# Patient Record
Sex: Female | Born: 1980 | Race: Black or African American | Hispanic: No | Marital: Married | Smoking: Current every day smoker
Health system: Southern US, Community
[De-identification: ages and names within clinical notes are randomized; demographics above are authoritative.]

## PROBLEM LIST (undated history)

## (undated) DIAGNOSIS — I10 Essential (primary) hypertension: Secondary | ICD-10-CM

## (undated) HISTORY — DX: Essential (primary) hypertension: I10

---

## 2000-11-29 ENCOUNTER — Encounter: Payer: Self-pay | Admitting: Obstetrics

## 2000-11-29 ENCOUNTER — Inpatient Hospital Stay (HOSPITAL_COMMUNITY): Admission: AD | Admit: 2000-11-29 | Discharge: 2000-11-29 | Payer: Self-pay | Admitting: Obstetrics

## 2002-05-07 ENCOUNTER — Emergency Department (HOSPITAL_COMMUNITY): Admission: EM | Admit: 2002-05-07 | Discharge: 2002-05-07 | Payer: Self-pay | Admitting: Emergency Medicine

## 2002-09-26 ENCOUNTER — Emergency Department (HOSPITAL_COMMUNITY): Admission: EM | Admit: 2002-09-26 | Discharge: 2002-09-26 | Payer: Self-pay | Admitting: Emergency Medicine

## 2004-04-10 ENCOUNTER — Emergency Department (HOSPITAL_COMMUNITY): Admission: EM | Admit: 2004-04-10 | Discharge: 2004-04-10 | Payer: Self-pay | Admitting: Family Medicine

## 2005-10-19 ENCOUNTER — Ambulatory Visit (HOSPITAL_COMMUNITY): Admission: RE | Admit: 2005-10-19 | Discharge: 2005-10-19 | Payer: Self-pay | Admitting: Obstetrics & Gynecology

## 2006-03-27 ENCOUNTER — Inpatient Hospital Stay (HOSPITAL_COMMUNITY): Admission: AD | Admit: 2006-03-27 | Discharge: 2006-03-31 | Payer: Self-pay | Admitting: Obstetrics

## 2006-07-17 IMAGING — US US OB COMP +14 WK
1 series · 13 of 28 positions shown · non-contrast
Comparison: none

CLINICAL DATA: 18 week 0 day gestational age by LMP.  Evaluate dating and anatomy.

[Series 1: us ob comp +14 wk · 0.27mm/px · 13 of 122 slices shown]
[im 5/122]
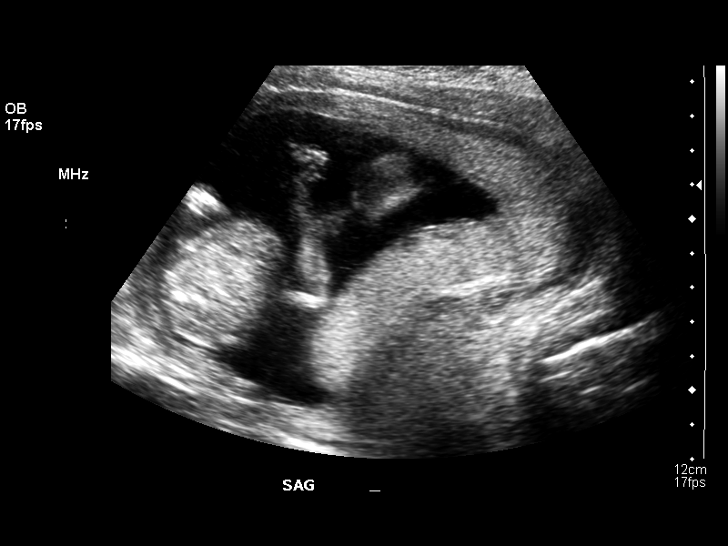
[im 14/122]
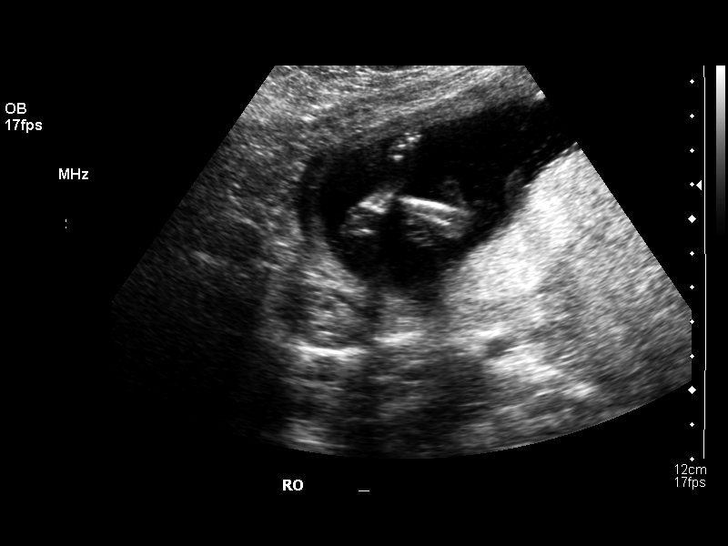
[im 23/122]
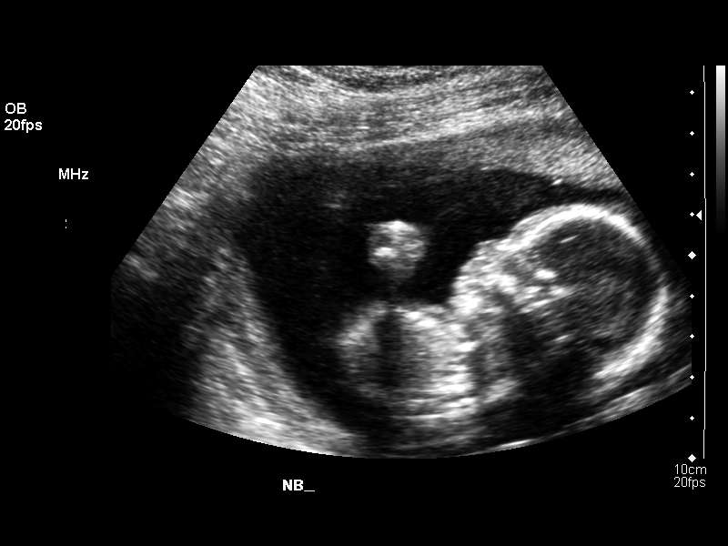
[im 32/122]
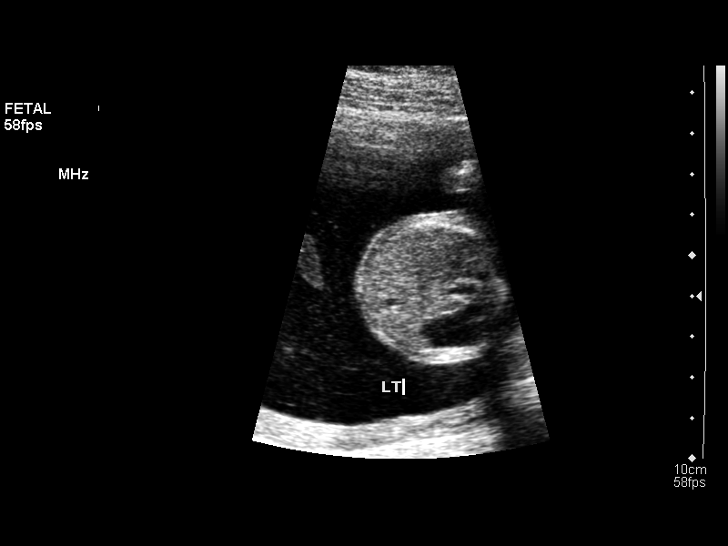
[im 41/122]
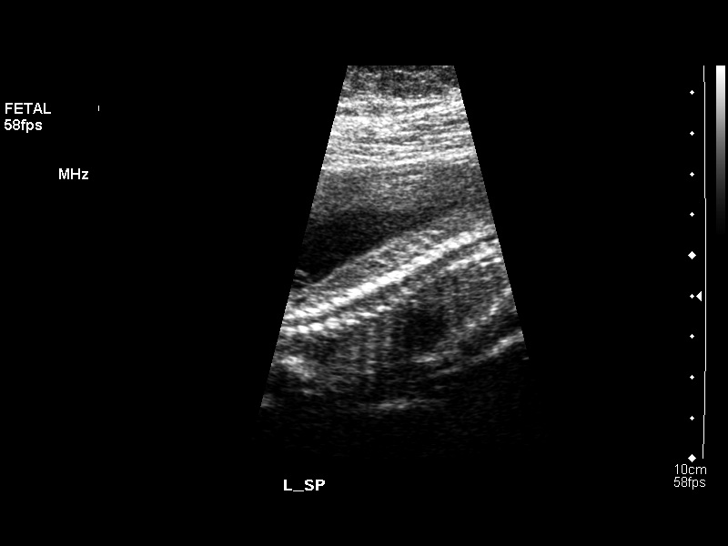
[im 50/122]
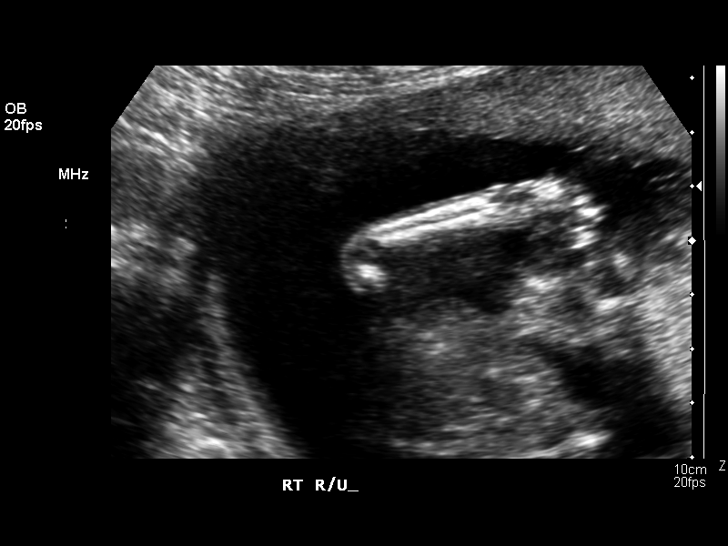
[im 63/122]
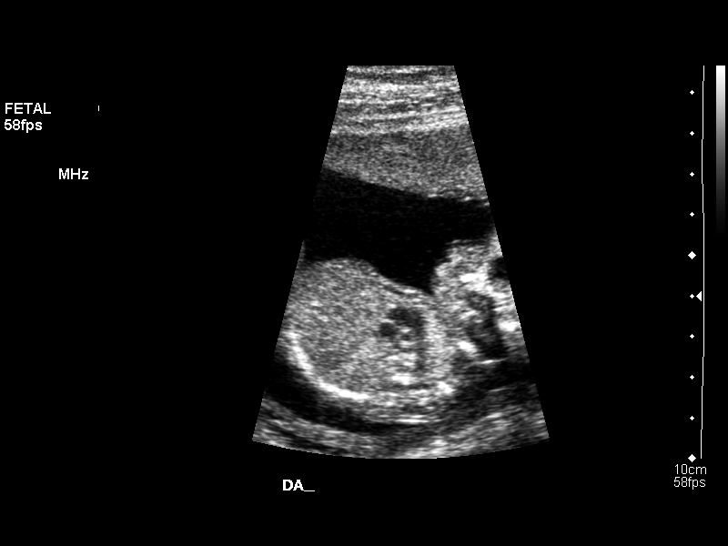
[im 72/122]
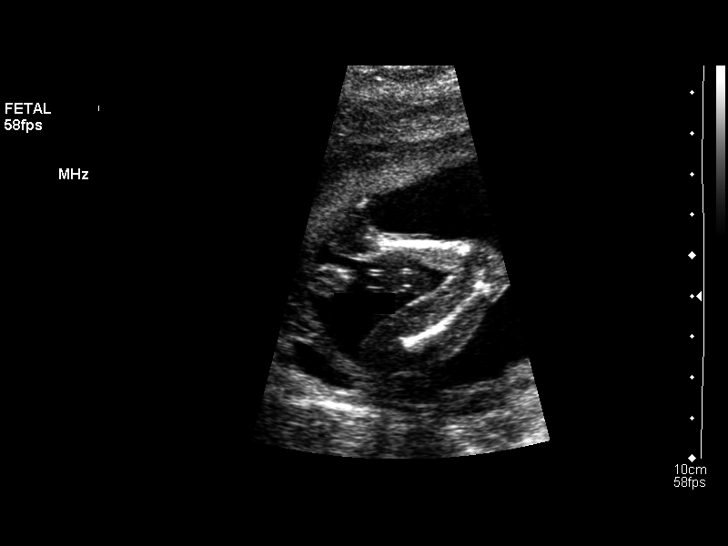
[im 81/122]
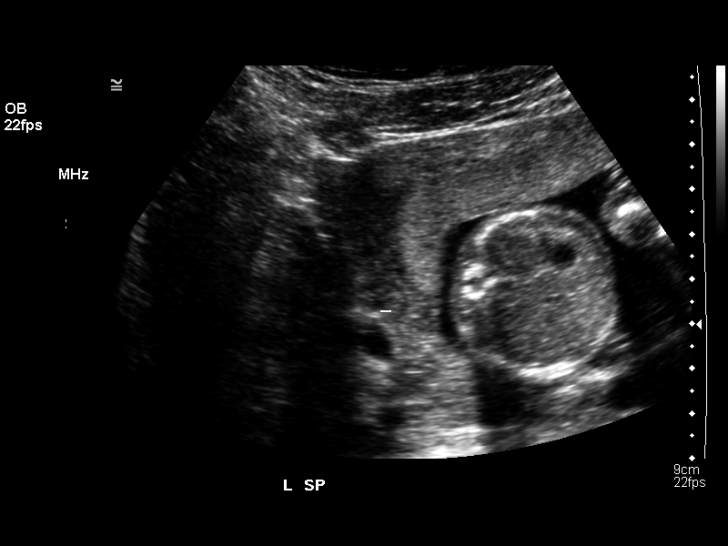
[im 90/122]
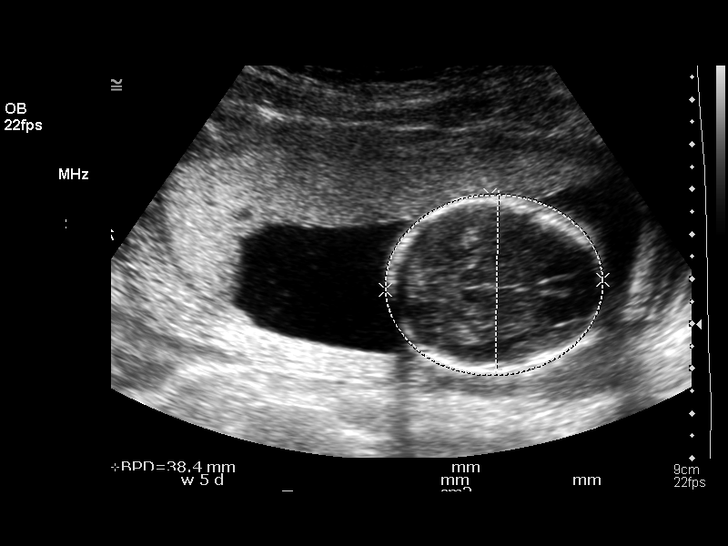
[im 99/122]
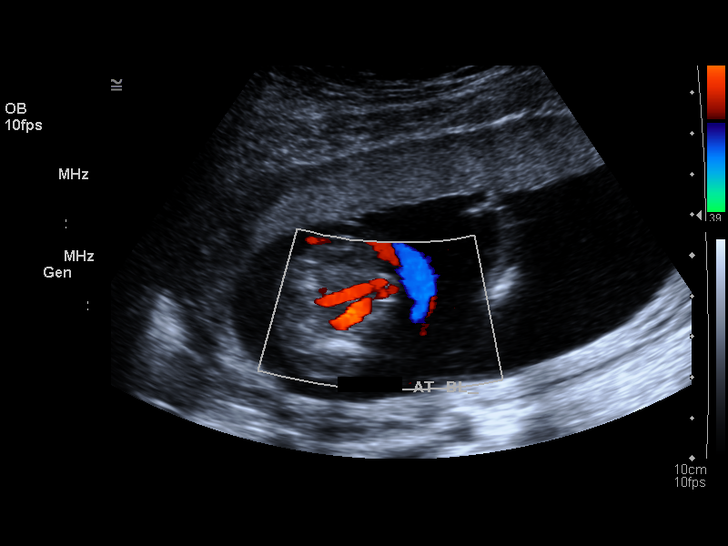
[im 108/122]
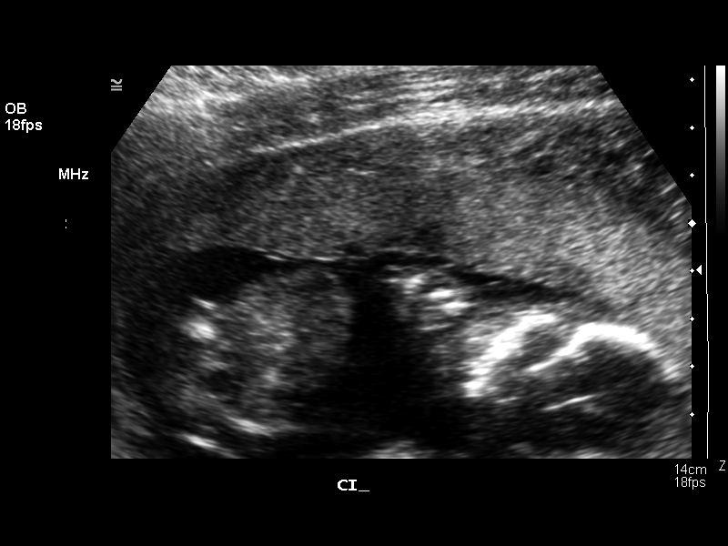
[im 117/122]
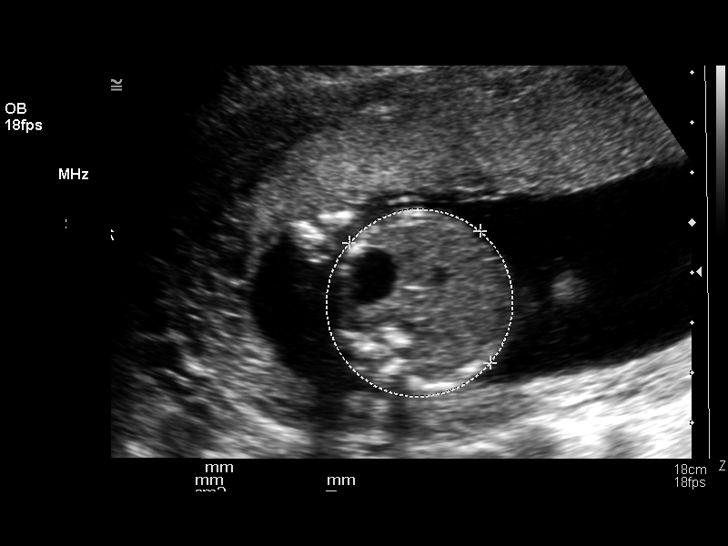

[13 of 28 positions shown; findings below may reference images not displayed]

OBSTETRICAL ULTRASOUND:
 Number of Fetuses:  1
 Heart Rate:  147 BPM
 Movement:  Yes
 Breathing:  No  
 Presentation:  Cephalic
 Placental Location:  Anterior
 Grade:  I
 Previa:  No
 Amniotic Fluid (Subjective):  Normal
 Amniotic Fluid (Objective):   4.0 cm vertical pocket 

 FETAL BIOMETRY
 BPD:   3.8 cm  17 W 5 D
 HC:   14.1 cm  17 W 3 D
 AC:   11.8 cm  17 W 3 D
 FL:   2.7 cm  18 W 2 D

 MEAN GA:  17 W 5 D  Ultrasound EDC:  03/24/06

 FETAL ANATOMY
 Lateral Ventricles:  Visualized 
 Thalami/CSP:  Visualized   
 Posterior Fossa:  Visualized   
 Nuchal Region:  Visualized 
 Spine:  Visualized   
 4 Chamber Heart on Left:  Visualized   
 Stomach on Left:  Visualized   
 3 Vessel Cord:  Visualized 
 Cord Insertion site:  Visualized 
 Kidneys:  Visualized   
 Bladder:  Visualized   
 Extremities:  Visualized   

 ADDITIONAL ANATOMY VISUALIZED:  RVOT, LVOT, upper lip, orbits, profile, diaphragm, heel, fifth digit, ductal arch, and aortic arch.

 MATERNAL UTERINE AND ADNEXAL FINDINGS
 Cervix:   3.1 cm transabdominally.
 Both ovaries are unremarkable.
IMPRESSION: 1.  Single living intrauterine fetus with mean gestational age of 17 weeks 5 days and sonographic EDC of 03/24/06.  This is concordant with LMP.
 2.  No evidence of fetal anatomic abnormality.

## 2018-11-26 ENCOUNTER — Ambulatory Visit: Payer: Self-pay | Admitting: *Deleted

## 2018-11-26 NOTE — Telephone Encounter (Signed)
Pt calling COVID line with nausea, vomiting, diarrhea. Does not have PCP. Onset this AM. 4 episodes of vomiting and 3 of diarrhea. Afebrile. Mild stomach cramping. No S/S dehydration. No dizziness. Home care advise given. Verbalizes understanding.   Reason for Disposition . MILD or MODERATE vomiting (e.g., 1 - 5 times / day)  Answer Assessment - Initial Assessment Questions 1. VOMITING SEVERITY: "How many times have you vomited in the past 24 hours?"     - MILD:  1 - 2 times/day    - MODERATE: 3 - 5 times/day, decreased oral intake without significant weight loss or symptoms of dehydration    - SEVERE: 6 or more times/day, vomits everything or nearly everything, with significant weight loss, symptoms of dehydration      4 times 2. ONSET: "When did the vomiting begin?"      This AM 3. FLUIDS: "What fluids or food have you vomited up today?" "Have you been able to keep any fluids down?"     Some 4. ABDOMINAL PAIN: "Are your having any abdominal pain?" If yes : "How bad is it and what does it feel like?" (e.g., crampy, dull, intermittent, constant)      Some cramping 5. DIARRHEA: "Is there any diarrhea?" If so, ask: "How many times today?"      3 episodes  6. CONTACTS: "Is there anyone else in the family with the same symptoms?"      no 7. CAUSE: "What do you think is causing your vomiting?"     unsure 8. HYDRATION STATUS: "Any signs of dehydration?" (e.g., dry mouth [not only dry lips], too weak to stand) "When did you last urinate?"     *No Answer* 9. OTHER SYMPTOMS: "Do you have any other symptoms?" (e.g., fever, headache, vertigo, vomiting blood or coffee grounds, recent head injury)     no 10. PREGNANCY: "Is there any chance you are pregnant?" "When was your last menstrual period?"      No on depo  Protocols used: [facility]

## 2019-06-17 ENCOUNTER — Other Ambulatory Visit: Payer: Self-pay | Admitting: Family Medicine

## 2019-06-17 DIAGNOSIS — Z1231 Encounter for screening mammogram for malignant neoplasm of breast: Secondary | ICD-10-CM

## 2019-07-12 ENCOUNTER — Other Ambulatory Visit: Payer: Self-pay

## 2019-07-12 ENCOUNTER — Encounter: Payer: Self-pay | Admitting: Cardiology

## 2019-07-12 ENCOUNTER — Ambulatory Visit: Payer: BC Managed Care – PPO | Admitting: Cardiology

## 2019-07-12 VITALS — BP 149/97 | HR 118 | Temp 97.3°F | Ht 68.75 in | Wt 209.0 lb

## 2019-07-12 DIAGNOSIS — Z8241 Family history of sudden cardiac death: Secondary | ICD-10-CM | POA: Diagnosis not present

## 2019-07-12 DIAGNOSIS — I429 Cardiomyopathy, unspecified: Secondary | ICD-10-CM | POA: Diagnosis not present

## 2019-07-12 DIAGNOSIS — Z716 Tobacco abuse counseling: Secondary | ICD-10-CM | POA: Diagnosis not present

## 2019-07-12 DIAGNOSIS — R030 Elevated blood-pressure reading, without diagnosis of hypertension: Secondary | ICD-10-CM

## 2019-07-12 DIAGNOSIS — Z7182 Exercise counseling: Secondary | ICD-10-CM

## 2019-07-12 DIAGNOSIS — Z7189 Other specified counseling: Secondary | ICD-10-CM

## 2019-07-12 DIAGNOSIS — Z713 Dietary counseling and surveillance: Secondary | ICD-10-CM

## 2019-07-12 NOTE — Progress Notes (Signed)
Cardiology Office Note:    Date:  07/12/2019   ID:  Andrea Chavez, DOB [birth year], MRN [id number]  PCP:  Ayesha Rumpf, FNP  Cardiologist:  Jodelle Red, MD  Referring MD: Ayesha Rumpf, FNP   CC: New patient consultation for family history of cardiomyopathy  History of Present Illness:    Andrea Chavez is a 38 y.o. female without prior PMH who is seen as a new consult at the request of Ayesha Rumpf, FNP for the evaluation and management of family history of cardiomyopathy.  Cardiovascular risk factors: Prior clinical ASCVD: none Comorbid conditions: hadn't seen a doctor in about 13 years, just got a PCP for the first time in a long time. Never been told she has high cholesterol. Is prediabetic. No kidney disease. BP has been elevated but she is very stressed. Metabolic syndrome/Obesity: BMI 31 Chronic inflammatory conditions: none Tobacco use history: current. Since age 35. Smoking 3-4 cigarettes/day. Knows she needs to quit, but under a lot of stress. 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 Trying to get back into walking on a regular basis. No limitations. Current diet: varied diet. Tried to go vegetarian, but didn't last. Working on more fruits, vegetables, beans, etc. 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   Father died age 14, found in his house. Very healthy, exercised regularly, ate well. 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 However, no specific genetic testing done.  He was retired Hotel manager, regular routine medical exams. Normal blood pressure, had high cholesterol. No prior cardiac history.   Family history: She is his only child. She has two children, both girls, 18 and 13. Healthy overall  (allergies/eczema)  Her father had 9 siblings. Two of his brother are also deceased; Jerolyn Shin thought to have MI, Bernette Redbird had multiple medical issues and unclear cause of death. They both passed away in their late 28s. Other siblings still living, no known heart issues.  Paternal grandfather had heart issues, unknown what type. Paternal grandmother was a diabetic, had spinal cancer.  No known cousins, etc with heart issues that she is aware of.    History reviewed. No pertinent past medical history.  History reviewed. No pertinent surgical history.  Current Medications: Current Outpatient Medications on File Prior to Visit  Medication Sig  . medroxyPROGESTERone (DEPO-PROVERA) 150 MG/ML injection INJECT 1 (ONE) MILLILITER INTO THE MUSCLE EVERY 11 12 WEEKS   No current facility-administered medications on file prior to visit.      Allergies:   Patient has no allergy information on record.   Social History   Tobacco Use  . Smoking status: Current Every Day Smoker    Packs/day: 0.25  . Smokeless tobacco: Never Used  Substance Use Topics  . Alcohol use: Not on file  . Drug use: Not on file    Family History: Full cardiac family history lineage as above in HPI  ROS:   Please see the history of present illness.  Additional pertinent ROS: Constitutional: Negative for chills, fever, night sweats, unintentional weight loss  HENT: Negative for ear pain and hearing loss.   Eyes: Negative for loss of vision and eye pain.  Respiratory: Negative for cough, sputum, wheezing.   Cardiovascular: See HPI. Gastrointestinal: Negative for abdominal pain, melena, and hematochezia.  Genitourinary: Negative for dysuria  and hematuria.  Musculoskeletal: Negative for falls and myalgias.  Skin: Negative for itching and rash.  Neurological: Negative for focal weakness, focal sensory changes and loss of consciousness.  Endo/Heme/Allergies: Does not bruise/bleed easily.     EKGs/Labs/Other Studies  Reviewed:    The [facility] reviewed today[facility]  EKG:  EKG is personally reviewed.  The ekg ordered today demonstrates sinus tachycardia at 108 bpm.  Recent Labs: No results found for requested labs within last 8760 hours.  Recent Lipid Panel No results found for: CHOL, TRIG, HDL, CHOLHDL, VLDL, LDLCALC, LDLDIRECT  Physical Exam:    VS:  BP (!) 149/97   Pulse (!) 118   Temp (!) 97.3 F (36.3 C)   Ht 5' 8.75" (1.746 m)   Wt 209 lb (94.8 kg)   SpO2 97%   BMI 31.09 kg/m     Wt Readings from Last 3 Encounters:  07/12/19 209 lb (94.8 kg)    GEN: Well nourished, well developed in no acute distress HEENT: Normal, moist mucous membranes NECK: No JVD CARDIAC: regular rhythm, normal S1 and S2, no rubs or gallops. No murmurs. PMI not displaced. VASCULAR: Radial and DP pulses 2+ bilaterally. No carotid bruits RESPIRATORY:  Clear to auscultation without rales, wheezing or rhonchi  ABDOMEN: Soft, non-tender, non-distended MUSCULOSKELETAL:  Ambulates independently SKIN: Warm and dry, no edema NEUROLOGIC:  Alert and oriented x 3. No focal neuro deficits noted. PSYCHIATRIC:  Normal affect    ASSESSMENT:    1. Familial cardiomyopathy (HCC)   2. Family history of sudden cardiac death   3. Cardiac risk counseling   4. Counseling on health promotion and disease prevention   5. Elevated blood pressure reading   6. Nutritional counseling   7. Exercise counseling   8. Tobacco abuse counseling    PLAN:    Family history of cardiomyopathy/sudden cardiac death: father died suddenly age 39, autopsy with cardiomyopathy, no prior cardiac history -family tree per HPI. Is some concern that there are family members with undisclosed heart issues/young death -unknown if HCM or DCM. No genetic testing done -ECG unremarkable today -will screen with echocardiogram. If borderline, screen again in 1 year, if normal screen every 3-5 years. -discussed referral to Dr.  Jomarie LongsJoseph for genetic testing/discussion. She would like the results of the echo back first  Elevated BP reading: under extreme stress. Wants to work on diet/exercise first. Going to get a home BP cuff, counseled on what to look for today --counseled on how to check blood pressure:  -sit comfortably in a chair, feet uncrossed and flat on floor, for 5-10 minutes  -arm ideally should rest at the level of the heart. However, arm should be relaxed and not tense (for example, do not hold the arm up unsupported)  -avoid exercise, caffeine, and tobacco for at least 30 minutes prior to BP reading  -don't take BP cuff reading over clothes (always place on skin directly)  -I prefer to know how well the medication is working, so I would like you to take your readings 1-2 hours after taking your blood pressure medication if possible  Tobacco abuse counseling: The patient was counseled on tobacco cessation today for 4 minutes.  Counseling included reviewing the risks of smoking tobacco products, how it impacts the patient's current medical diagnoses and different strategies for quitting.  Pharmacotherapy to aid in tobacco cessation was not prescribed today.  Cardiac risk counseling and prevention recommendations: -recommend heart healthy/Mediterranean diet, with whole grains, fruits,  vegetable, fish, lean meats, nuts, and olive oil. Limit salt. -recommend moderate walking, 3-5 times/week for 30-50 minutes each session. Aim for at least 150 minutes.week. Goal should be pace of 3 miles/hours, or walking 1.5 miles in 30 minutes -recommend avoidance of tobacco products. Avoid excess alcohol. -ASCVD risk score: The ASCVD Risk score Mikey Bussing DC Jr., et al., 2013) failed to calculate for the following reasons:   The 2013 ASCVD risk score is only valid for ages 49 to 30    Plan for follow up: 6 mos or sooner PRN  Medication Adjustments/Labs and Tests Ordered: Current medicines are reviewed at length with the patient  today.  Concerns regarding medicines are outlined above.  Orders Placed This Encounter  Procedures  . EKG 12-Lead  . ECHOCARDIOGRAM COMPLETE   No orders of the defined types were placed in this encounter.   Patient Instructions  Medication Instructions:  Your Physician recommend you continue on your current medication as directed.    *If you need a refill on your cardiac medications before your next appointment, please call your pharmacy*  Lab Work: None  Testing/Procedures: Your physician has requested that you have an echocardiogram. Echocardiography is a painless test that uses sound waves to create images of your heart. It provides your doctor with information about the size and shape of your heart and how well your heart's chambers and valves are working. This procedure takes approximately one hour. There are no restrictions for this procedure. [address]   Follow-Up: At [facility], you and your health needs are our priority.  As part of our continuing mission to provide you with exceptional heart care, we have created designated Provider Care Teams.  These Care Teams include your primary Cardiologist (physician) and Advanced Practice Providers (APPs -  Physician Assistants and Nurse Practitioners) who all work together to provide you with the care you need, when you need it.  Your next appointment:   6 months  The format for your next appointment:   In Person  Provider:   Buford Dresser, MD   Resources on familial heart disease  [email or web address]     Signed, Buford Dresser, MD PhD 07/12/2019 1:32 PM    [facility]

## 2019-07-12 NOTE — Patient Instructions (Addendum)
Medication Instructions:  Your Physician recommend you continue on your current medication as directed.    *If you need a refill on your cardiac medications before your next appointment, please call your pharmacy*  Lab Work: None  Testing/Procedures: Your physician has requested that you have an echocardiogram. Echocardiography is a painless test that uses sound waves to create images of your heart. It provides your doctor with information about the size and shape of your heart and how well your heart's chambers and valves are working. This procedure takes approximately one hour. There are no restrictions for this procedure. [address]   Follow-Up: At [facility], you and your health needs are our priority.  As part of our continuing mission to provide you with exceptional heart care, we have created designated Provider Care Teams.  These Care Teams include your primary Cardiologist (physician) and Advanced Practice Providers (APPs -  Physician Assistants and Nurse Practitioners) who all work together to provide you with the care you need, when you need it.  Your next appointment:   6 months  The format for your next appointment:   In Person  Provider:   Buford Dresser, MD   Resources on familial heart disease  [email or web address]

## 2019-07-19 ENCOUNTER — Other Ambulatory Visit (HOSPITAL_COMMUNITY): Payer: BC Managed Care – PPO

## 2019-08-05 ENCOUNTER — Ambulatory Visit (HOSPITAL_COMMUNITY): Payer: BC Managed Care – PPO | Attending: Cardiology

## 2019-08-05 ENCOUNTER — Other Ambulatory Visit: Payer: Self-pay

## 2019-08-05 DIAGNOSIS — I429 Cardiomyopathy, unspecified: Secondary | ICD-10-CM | POA: Insufficient documentation

## 2019-08-05 DIAGNOSIS — Z8241 Family history of sudden cardiac death: Secondary | ICD-10-CM | POA: Diagnosis not present

## 2019-08-14 ENCOUNTER — Ambulatory Visit: Payer: BC Managed Care – PPO | Admitting: Cardiology

## 2019-11-02 ENCOUNTER — Ambulatory Visit: Payer: BC Managed Care – PPO | Attending: Internal Medicine

## 2019-11-02 DIAGNOSIS — Z23 Encounter for immunization: Secondary | ICD-10-CM | POA: Insufficient documentation

## 2019-11-02 NOTE — Progress Notes (Signed)
   Covid-19 Vaccination Clinic  Name:  Andrea Chavez    MRN: [id number] DOB: Nov 01, 1980  11/02/2019  [name] was observed post Covid-19 immunization for 15 minutes without incidence. She was provided with Vaccine Information Sheet and instruction to access the V-Safe system.   [name] was instructed to call 911 with any severe reactions post vaccine: Marland Kitchen Difficulty breathing  . Swelling of your face and throat  . A fast heartbeat  . A bad rash all over your body  . Dizziness and weakness    Immunizations Administered    Name Date Dose VIS Date Route   Pfizer COVID-19 Vaccine 11/02/2019  1:19 PM 0.3 mL 08/16/2019 Intramuscular   Manufacturer: [employer], [employer]   Lot: [id number]   NDC: [address]-4

## 2019-11-23 ENCOUNTER — Ambulatory Visit: Payer: BC Managed Care – PPO | Attending: Internal Medicine

## 2019-11-23 DIAGNOSIS — Z23 Encounter for immunization: Secondary | ICD-10-CM

## 2019-11-23 NOTE — Progress Notes (Signed)
   Covid-19 Vaccination Clinic  Name:  Andrea Chavez    MRN: [id number] DOB: 12-Feb-1981  11/23/2019  [name] was observed post Covid-19 immunization for 15 minutes without incident. She was provided with Vaccine Information Sheet and instruction to access the V-Safe system.   [name] was instructed to call 911 with any severe reactions post vaccine: Marland Kitchen Difficulty breathing  . Swelling of face and throat  . A fast heartbeat  . A bad rash all over body  . Dizziness and weakness   Immunizations Administered    Name Date Dose VIS Date Route   Pfizer COVID-19 Vaccine 11/23/2019  8:06 AM 0.3 mL 08/16/2019 Intramuscular   Manufacturer: [employer], [employer]   Lot: [id number]   NDC: [address]-6

## 2020-01-06 ENCOUNTER — Encounter: Payer: Self-pay | Admitting: Cardiology

## 2020-01-06 ENCOUNTER — Telehealth (INDEPENDENT_AMBULATORY_CARE_PROVIDER_SITE_OTHER): Payer: BC Managed Care – PPO | Admitting: Cardiology

## 2020-01-06 VITALS — BP 134/102 | HR 111

## 2020-01-06 DIAGNOSIS — Z7182 Exercise counseling: Secondary | ICD-10-CM

## 2020-01-06 DIAGNOSIS — I10 Essential (primary) hypertension: Secondary | ICD-10-CM

## 2020-01-06 DIAGNOSIS — Z8241 Family history of sudden cardiac death: Secondary | ICD-10-CM | POA: Insufficient documentation

## 2020-01-06 DIAGNOSIS — I429 Cardiomyopathy, unspecified: Secondary | ICD-10-CM

## 2020-01-06 DIAGNOSIS — Z7189 Other specified counseling: Secondary | ICD-10-CM

## 2020-01-06 NOTE — Progress Notes (Signed)
Virtual Visit via Telephone Note   This visit type was conducted due to national recommendations for restrictions regarding the COVID-19 Pandemic (e.g. social distancing) in an effort to limit this patient's exposure and mitigate transmission in our community.  Due to her co-morbid illnesses, this patient is at least at moderate risk for complications without adequate follow up.  This format is felt to be most appropriate for this patient at this time.  The patient did not have access to video technology/had technical difficulties with video requiring transitioning to audio format only (telephone).  All issues noted in this document were discussed and addressed.  No physical exam could be performed with this format.  Please refer to the patient's chart for her  consent to telehealth for [facility].   The patient was identified using 2 identifiers.  Date:  01/06/2020   ID:  Andrea Chavez, DOB Jan 21, 1981, MRN [id number]  Patient Location: Home Provider Location: Home  PCP:  Ayesha Rumpf, FNP  Cardiologist:  Jodelle Red, MD  Electrophysiologist:  None   Evaluation Performed:  Follow-Up Visit  Chief Complaint:  Follow up  History of Present Illness:    Andrea Chavez is a 39 y.o. female with a family history of cardiomyopathy. Please see initial note from 07/12/19. CV risk factors of family history, current tobacco use.  The patient does not have symptoms concerning for COVID-19 infection (fever, chills, cough, or new shortness of breath).   Today: Aunt (maternal) passed away recently, and she just had the anniversary of her father's death recently as well. Stayed safe with pandemic, fully vaccinated. Her school has recently had several confirmed cases of Covid.   Has started chlorthalidone since our last visit. Checks intermittently at home, has been 120/90. Didn't take meds for a week when she was visiting family, but is back taking now that she is back home.  FH  update: one of her father's brothers just had bypass surgery in his 58s. Was told that there were cousins on her dad's side that passed away. She also found out that just before her father passed away, he had an episode of syncope in the bathroom. He went to the ER to be evaluated, but he didn't tell anyone what they said.  Denies chest pain, shortness of breath at rest or with normal exertion. No PND, orthopnea, LE edema or unexpected weight gain. No syncope or palpitations.  Past Medical History:  Diagnosis Date  . Hypertension    History reviewed. No pertinent surgical history.   Current Meds  Medication Sig  . chlorthalidone (HYGROTON) 25 MG tablet Take 25 mg by mouth daily.  . hydrOXYzine (ATARAX/VISTARIL) 25 MG tablet Take 25 mg by mouth daily as needed.  . medroxyPROGESTERone (DEPO-PROVERA) 150 MG/ML injection INJECT 1 (ONE) MILLILITER INTO THE MUSCLE EVERY 11 12 WEEKS     Allergies:   Patient has no known allergies.   Social History   Tobacco Use  . Smoking status: Current Every Day Smoker    Packs/day: 0.25  . Smokeless tobacco: Never Used  Substance Use Topics  . Alcohol use: Not on file  . Drug use: Not on file     Family Hx: Father died age 66, found in his house. Very healthy, exercised regularly, ate well. 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 However, no specific genetic testing done.  He was retired [employer], regular routine medical exams. Normal blood pressure, had high cholesterol. No prior cardiac history. Had an  episode of syncope of unclear etiology about a month before he passed away.  Family history: She is his only child. She has two children, both girls, 18 and 13. Healthy overall (allergies/eczema)  Her father had 9 siblings. Two of his brother are also deceased; Leane Para thought to have MI, Grayland Ormond had multiple medical issues and unclear cause of death. They both passed away in their late 91s. One of her father's brothers just  had bypass surgery in his 37s. Was told that there were cousins on her dad's side that passed away  Paternal grandfather had heart issues, unknown what type. Paternal grandmother was a diabetic, had spinal cancer.  No known cousins, etc with heart issues that she is aware of.   ROS:   Please see the history of present illness.    All other systems reviewed and are negative.   Prior CV studies:   The following studies were reviewed today: Echo 08/05/19 1. Left ventricular ejection fraction, by visual estimation, is 60 to  65%. The left ventricle has normal function. There is moderately increased  left ventricular hypertrophy.  2. Global right ventricle has normal systolic function.The right  ventricular size is normal.  3. Left atrial size was normal.  4. Right atrial size was normal.  5. The mitral valve is normal in structure. No evidence of mitral valve  regurgitation.  6. The tricuspid valve is normal in structure. Tricuspid valve  regurgitation is trivial.  7. The aortic valve is tricuspid. Aortic valve regurgitation is not  visualized. No evidence of aortic valve sclerosis or stenosis.  8. The pulmonic valve was not well visualized. Pulmonic valve  regurgitation is not visualized.  9. The inferior vena cava is normal in size with greater than 50%  respiratory variability, suggesting right atrial pressure of 3 mmHg.  10. TR signal is inadequate for assessing pulmonary artery systolic  pressure.   LEFT VENTRICLE  PLAX 2D  LVIDd:     4.40 cm Diastology  LVIDs:     3.10 cm LV e' lateral:  9.68 cm/s  LV PW:     1.40 cm LV E/e' lateral: 7.8  LV IVS:    1.40 cm LV e' medial:  8.27 cm/s  LVOT diam:   2.00 cm LV E/e' medial: 9.2  LV SV:     50 ml  LV SV Index:  22.92  LVOT Area:   3.14 cm   Labs/Other Tests and Data Reviewed:    EKG:  An ECG dated 07/12/19 was personally reviewed today and demonstrated:  sinus  tachycardia  Recent Labs: No results found for requested labs within last 8760 hours.   Recent Lipid Panel No results found for: CHOL, TRIG, HDL, CHOLHDL, LDLCALC, LDLDIRECT  Wt Readings from Last 3 Encounters:  07/12/19 209 lb (94.8 kg)     Objective:    Vital Signs:  BP (!) 134/102   Pulse (!) 111    Speaking comfortably on the phone, no audible wheezing In no acute distress Alert and oriented Normal affect Normal speech  ASSESSMENT & PLAN:    Family history of cardiomyopathy/sudden cardiac death: father died suddenly age 31, autopsy with cardiomyopathy, no prior cardiac history -concern that there are family members with undisclosed heart issues/young death -unknown if HCM or DCM. No genetic testing done on her father. We discussed genetic testing at initial visit. -ECG unremarkable  -echo with some LVH (1.4 cm), not severe, may be due to hypertension -no high risk features such as syncope -  discussed when she needs to get further evaluation -recheck echo periodically -if any concerning history, would get monitor and treadmill  Hypertension: -now on chlorthalidone -missed several doses with traveling, but now restarting -goal <130/80. Her diastolic number has remained elevated  -counseled on lifestyle, exercise, relaxation -may need an additional medication such as amlodipine if remains above goal -discussed checking home BP  Tobacco abuse counseling: recommend cessation  Cardiac risk counseling and prevention recommendations: -recommend heart healthy/Mediterranean diet, with whole grains, fruits, vegetable, fish, lean meats, nuts, and olive oil. Limit salt. -recommend moderate walking, 3-5 times/week for 30-50 minutes each session. Aim for at least 150 minutes.week. Goal should be pace of 3 miles/hours, or walking 1.5 miles in 30 minutes -recommend avoidance of tobacco products. Avoid excess alcohol. -ASCVD risk score: The ASCVD Risk score Denman George DC Jr., et al.,  2013) failed to calculate for the following reasons:   The 2013 ASCVD risk score is only valid for ages 53 to 24    COVID-19 Education: The signs and symptoms of COVID-19 were discussed with the patient and how to seek care for testing (follow up with PCP or arrange E-visit). The importance of social distancing was discussed today.  Time:   Today, I have spent 11 minutes with the patient with telehealth technology discussing the above problems.    Patient Instructions  Medication Instructions:  Your Physician recommend you continue on your current medication as directed.    *If you need a refill on your cardiac medications before your next appointment, please call your pharmacy*   Lab Work: None   Testing/Procedures: None   Follow-Up: At [facility], you and your health needs are our priority.  As part of our continuing mission to provide you with exceptional heart care, we have created designated Provider Care Teams.  These Care Teams include your primary Cardiologist (physician) and Advanced Practice Providers (APPs -  Physician Assistants and Nurse Practitioners) who all work together to provide you with the care you need, when you need it.  We recommend signing up for the patient portal called "MyChart".  Sign up information is provided on this After Visit Summary.  MyChart is used to connect with patients for Virtual Visits (Telemedicine).  Patients are able to view lab/test results, encounter notes, upcoming appointments, etc.  Non-urgent messages can be sent to your provider as well.   To learn more about what you can do with MyChart, go to [email or web address].    Your next appointment:   1 year(s)  The format for your next appointment:   In Person  Provider:   Jodelle Red, MD      Signed, Jodelle Red, MD  01/06/2020 8:33 AM    [facility]

## 2020-01-06 NOTE — Patient Instructions (Signed)

## 2022-11-22 ENCOUNTER — Ambulatory Visit: Payer: Self-pay

## 2022-11-22 ENCOUNTER — Other Ambulatory Visit: Payer: Self-pay | Admitting: Nurse Practitioner

## 2022-11-22 DIAGNOSIS — M533 Sacrococcygeal disorders, not elsewhere classified: Secondary | ICD-10-CM

## 2023-07-23 ENCOUNTER — Encounter (HOSPITAL_COMMUNITY): Payer: Self-pay

## 2023-07-23 ENCOUNTER — Other Ambulatory Visit: Payer: Self-pay

## 2023-07-23 ENCOUNTER — Emergency Department (HOSPITAL_COMMUNITY): Payer: BC Managed Care – PPO

## 2023-07-23 ENCOUNTER — Inpatient Hospital Stay (HOSPITAL_COMMUNITY)
Admission: EM | Admit: 2023-07-23 | Discharge: 2023-08-06 | DRG: 432 | Disposition: E | Payer: BC Managed Care – PPO | Attending: Internal Medicine | Admitting: Internal Medicine

## 2023-07-23 DIAGNOSIS — D6959 Other secondary thrombocytopenia: Secondary | ICD-10-CM | POA: Diagnosis present

## 2023-07-23 DIAGNOSIS — I10 Essential (primary) hypertension: Secondary | ICD-10-CM | POA: Diagnosis present

## 2023-07-23 DIAGNOSIS — G9341 Metabolic encephalopathy: Secondary | ICD-10-CM | POA: Diagnosis present

## 2023-07-23 DIAGNOSIS — N179 Acute kidney failure, unspecified: Principal | ICD-10-CM | POA: Diagnosis present

## 2023-07-23 DIAGNOSIS — K7031 Alcoholic cirrhosis of liver with ascites: Secondary | ICD-10-CM | POA: Diagnosis present

## 2023-07-23 DIAGNOSIS — Z79899 Other long term (current) drug therapy: Secondary | ICD-10-CM | POA: Diagnosis not present

## 2023-07-23 DIAGNOSIS — K7040 Alcoholic hepatic failure without coma: Principal | ICD-10-CM | POA: Diagnosis present

## 2023-07-23 DIAGNOSIS — Z515 Encounter for palliative care: Secondary | ICD-10-CM | POA: Diagnosis not present

## 2023-07-23 DIAGNOSIS — K7030 Alcoholic cirrhosis of liver without ascites: Secondary | ICD-10-CM | POA: Diagnosis present

## 2023-07-23 DIAGNOSIS — R578 Other shock: Secondary | ICD-10-CM | POA: Diagnosis present

## 2023-07-23 DIAGNOSIS — E872 Acidosis, unspecified: Secondary | ICD-10-CM | POA: Diagnosis present

## 2023-07-23 DIAGNOSIS — R5383 Other fatigue: Secondary | ICD-10-CM | POA: Diagnosis present

## 2023-07-23 DIAGNOSIS — Z8249 Family history of ischemic heart disease and other diseases of the circulatory system: Secondary | ICD-10-CM

## 2023-07-23 DIAGNOSIS — Z833 Family history of diabetes mellitus: Secondary | ICD-10-CM

## 2023-07-23 DIAGNOSIS — K7011 Alcoholic hepatitis with ascites: Secondary | ICD-10-CM | POA: Diagnosis present

## 2023-07-23 DIAGNOSIS — E162 Hypoglycemia, unspecified: Secondary | ICD-10-CM | POA: Diagnosis present

## 2023-07-23 DIAGNOSIS — D6489 Other specified anemias: Secondary | ICD-10-CM | POA: Diagnosis present

## 2023-07-23 DIAGNOSIS — Z66 Do not resuscitate: Secondary | ICD-10-CM | POA: Diagnosis not present

## 2023-07-23 DIAGNOSIS — K7682 Hepatic encephalopathy: Secondary | ICD-10-CM | POA: Diagnosis present

## 2023-07-23 DIAGNOSIS — Z8241 Family history of sudden cardiac death: Secondary | ICD-10-CM | POA: Diagnosis not present

## 2023-07-23 DIAGNOSIS — D689 Coagulation defect, unspecified: Secondary | ICD-10-CM | POA: Diagnosis present

## 2023-07-23 DIAGNOSIS — F17200 Nicotine dependence, unspecified, uncomplicated: Secondary | ICD-10-CM | POA: Diagnosis present

## 2023-07-23 DIAGNOSIS — F1010 Alcohol abuse, uncomplicated: Secondary | ICD-10-CM | POA: Diagnosis present

## 2023-07-23 DIAGNOSIS — Z7189 Other specified counseling: Secondary | ICD-10-CM

## 2023-07-23 DIAGNOSIS — E869 Volume depletion, unspecified: Secondary | ICD-10-CM | POA: Diagnosis present

## 2023-07-23 DIAGNOSIS — K7200 Acute and subacute hepatic failure without coma: Secondary | ICD-10-CM

## 2023-07-23 LAB — COMPREHENSIVE METABOLIC PANEL
ALT: 87 U/L — ABNORMAL HIGH (ref 0–44)
ALT: 82 U/L — ABNORMAL HIGH (ref 0–44)
AST: 361 U/L — ABNORMAL HIGH (ref 15–41)
AST: 363 U/L — ABNORMAL HIGH (ref 15–41)
Albumin: 2.3 g/dL — ABNORMAL LOW (ref 3.5–5.0)
Albumin: 2.2 g/dL — ABNORMAL LOW (ref 3.5–5.0)
Alkaline Phosphatase: 159 U/L — ABNORMAL HIGH (ref 38–126)
Alkaline Phosphatase: 124 U/L (ref 38–126)
Anion gap: 33 — ABNORMAL HIGH (ref 5–15)
Anion gap: 35 — ABNORMAL HIGH (ref 5–15)
BUN: 36 mg/dL — ABNORMAL HIGH (ref 6–20)
BUN: 37 mg/dL — ABNORMAL HIGH (ref 6–20)
CO2: 9 mmol/L — ABNORMAL LOW (ref 22–32)
CO2: 7 mmol/L — ABNORMAL LOW (ref 22–32)
Calcium: 8.1 mg/dL — ABNORMAL LOW (ref 8.9–10.3)
Calcium: 7.8 mg/dL — ABNORMAL LOW (ref 8.9–10.3)
Chloride: 88 mmol/L — ABNORMAL LOW (ref 98–111)
Chloride: 88 mmol/L — ABNORMAL LOW (ref 98–111)
Creatinine, Ser: 4.79 mg/dL — ABNORMAL HIGH (ref 0.44–1.00)
Creatinine, Ser: 4.7 mg/dL — ABNORMAL HIGH (ref 0.44–1.00)
GFR, Estimated: 11 mL/min — ABNORMAL LOW (ref >60)
GFR, Estimated: 11 mL/min — ABNORMAL LOW (ref >60)
Glucose, Bld: 134 mg/dL — ABNORMAL HIGH (ref 70–99)
Glucose, Bld: 91 mg/dL (ref 70–99)
Potassium: 3.9 mmol/L (ref 3.5–5.1)
Potassium: 4.2 mmol/L (ref 3.5–5.1)
Sodium: 130 mmol/L — ABNORMAL LOW (ref 135–145)
Sodium: 130 mmol/L — ABNORMAL LOW (ref 135–145)
Total Bilirubin: 24.5 mg/dL (ref <1.2)
Total Bilirubin: 22.7 mg/dL (ref <1.2)
Total Protein: 8.3 g/dL — ABNORMAL HIGH (ref 6.5–8.1)
Total Protein: 7.6 g/dL (ref 6.5–8.1)

## 2023-07-23 LAB — CBC
HCT: 28.6 % — ABNORMAL LOW (ref 36.0–46.0)
Hemoglobin: 9.2 g/dL — ABNORMAL LOW (ref 12.0–15.0)
MCH: 34.6 pg — ABNORMAL HIGH (ref 26.0–34.0)
MCHC: 32.2 g/dL (ref 30.0–36.0)
MCV: 107.5 fL — ABNORMAL HIGH (ref 80.0–100.0)
Platelets: 164 10*3/uL (ref 150–400)
RBC: 2.66 MIL/uL — ABNORMAL LOW (ref 3.87–5.11)
RDW: 20.1 % — ABNORMAL HIGH (ref 11.5–15.5)
WBC: 9.8 10*3/uL (ref 4.0–10.5)
nRBC: 0.3 % — ABNORMAL HIGH (ref 0.0–0.2)

## 2023-07-23 LAB — CBG MONITORING, ED
Glucose-Capillary: 156 mg/dL — ABNORMAL HIGH (ref 70–99)
Glucose-Capillary: 19 mg/dL — CL (ref 70–99)
Glucose-Capillary: 25 mg/dL — CL (ref 70–99)
Glucose-Capillary: 151 mg/dL — ABNORMAL HIGH (ref 70–99)
Glucose-Capillary: 159 mg/dL — ABNORMAL HIGH (ref 70–99)
Glucose-Capillary: 124 mg/dL — ABNORMAL HIGH (ref 70–99)

## 2023-07-23 LAB — MRSA NEXT GEN BY PCR, NASAL: MRSA by PCR Next Gen: NOT DETECTED

## 2023-07-23 LAB — HCG, SERUM, QUALITATIVE: Preg, Serum: NEGATIVE

## 2023-07-23 LAB — HIV ANTIBODY (ROUTINE TESTING W REFLEX): HIV Screen 4th Generation wRfx: NONREACTIVE

## 2023-07-23 LAB — I-STAT CG4 LACTIC ACID, ED: Lactic Acid, Venous: 14.5 mmol/L (ref 0.5–1.9)

## 2023-07-23 LAB — ACETAMINOPHEN LEVEL: Acetaminophen (Tylenol), Serum: <10 ug/mL — ABNORMAL LOW (ref 10–30)

## 2023-07-23 LAB — APTT: aPTT: 46 s — ABNORMAL HIGH (ref 24–36)

## 2023-07-23 LAB — PROTIME-INR
INR: 2.1 — ABNORMAL HIGH (ref 0.8–1.2)
Prothrombin Time: 23.8 s — ABNORMAL HIGH (ref 11.4–15.2)

## 2023-07-23 LAB — GLUCOSE, CAPILLARY
Glucose-Capillary: 74 mg/dL (ref 70–99)
Glucose-Capillary: 84 mg/dL (ref 70–99)

## 2023-07-23 LAB — LACTIC ACID, PLASMA: Lactic Acid, Venous: >9 mmol/L (ref 0.5–1.9)

## 2023-07-23 LAB — AMMONIA: Ammonia: 140 umol/L — ABNORMAL HIGH (ref 9–35)

## 2023-07-23 MED ORDER — ACETYLCYSTEINE LOAD VIA INFUSION
150.0000 mg/kg | Freq: Once | INTRAVENOUS | Status: AC
  Administered 2023-07-24: 9525 mg via INTRAVENOUS
  Filled 2023-07-23: qty 313

## 2023-07-23 MED ORDER — DEXTROSE 50 % IV SOLN
INTRAVENOUS | Status: AC
  Administered 2023-07-23: 50 mL via INTRAVENOUS
  Filled 2023-07-23: qty 50

## 2023-07-23 MED ORDER — CHLORHEXIDINE GLUCONATE CLOTH 2 % EX PADS
6.0000 | MEDICATED_PAD | Freq: Every day | CUTANEOUS | Status: DC
  Administered 2023-07-23: 6 via TOPICAL

## 2023-07-23 MED ORDER — LACTATED RINGERS IV BOLUS (SEPSIS)
1000.0000 mL | Freq: Once | INTRAVENOUS | Status: AC
  Administered 2023-07-23: 1000 mL via INTRAVENOUS

## 2023-07-23 MED ORDER — THIAMINE HCL 100 MG/ML IJ SOLN: 100.0000 mg | Freq: Every day | INTRAMUSCULAR | Status: DC

## 2023-07-23 MED ORDER — RIFAXIMIN 550 MG PO TABS: 550.0000 mg | ORAL_TABLET | Freq: Two times a day (BID) | ORAL | Status: DC

## 2023-07-23 MED ORDER — ORAL CARE MOUTH RINSE: 15.0000 mL | OROMUCOSAL | Status: DC | PRN

## 2023-07-23 MED ORDER — SODIUM CHLORIDE 0.9 % IV SOLN
2.0000 g | Freq: Once | INTRAVENOUS | Status: AC
  Administered 2023-07-23: 2 g via INTRAVENOUS
  Filled 2023-07-23: qty 12.5

## 2023-07-23 MED ORDER — DEXTROSE 5 % IV SOLN
15.0000 mg/kg/h | INTRAVENOUS | Status: DC
  Administered 2023-07-24: 15 mg/kg/h via INTRAVENOUS
  Filled 2023-07-23: qty 90

## 2023-07-23 MED ORDER — DEXTROSE-SODIUM CHLORIDE 5-0.45 % IV SOLN: INTRAVENOUS | Status: DC

## 2023-07-23 MED ORDER — GLUCAGON HCL RDNA (DIAGNOSTIC) 1 MG IJ SOLR
INTRAMUSCULAR | Status: AC
  Filled 2023-07-23: qty 1

## 2023-07-23 MED ORDER — DEXTROSE 50 % IV SOLN
50.0000 mL | Freq: Once | INTRAVENOUS | Status: AC
  Filled 2023-07-23: qty 50

## 2023-07-23 MED ORDER — ONDANSETRON HCL 4 MG/2ML IJ SOLN
4.0000 mg | Freq: Once | INTRAMUSCULAR | Status: DC
  Filled 2023-07-23: qty 2

## 2023-07-23 MED ORDER — LACTATED RINGERS IV SOLN: INTRAVENOUS | Status: DC

## 2023-07-23 MED ORDER — SODIUM BICARBONATE 8.4 % IV SOLN
INTRAVENOUS | Status: DC
  Filled 2023-07-23 (×2): qty 1000

## 2023-07-23 MED ORDER — THIAMINE HCL 100 MG/ML IJ SOLN: 200.0000 mg | Freq: Every day | INTRAVENOUS | Status: DC

## 2023-07-23 MED ORDER — LACTULOSE 10 GM/15ML PO SOLN: 30.0000 g | Freq: Three times a day (TID) | ORAL | Status: DC

## 2023-07-23 MED ORDER — LIDOCAINE HCL (PF) 1 % IJ SOLN
INTRAMUSCULAR | Status: AC
  Filled 2023-07-23: qty 5

## 2023-07-23 MED ORDER — THIAMINE HCL 100 MG/ML IJ SOLN
100.0000 mg | Freq: Once | INTRAMUSCULAR | Status: AC
  Administered 2023-07-23: 100 mg via INTRAVENOUS
  Filled 2023-07-23: qty 2

## 2023-07-23 MED ORDER — THIAMINE HCL 100 MG/ML IJ SOLN
500.0000 mg | Freq: Three times a day (TID) | INTRAVENOUS | Status: DC
  Administered 2023-07-23: 500 mg via INTRAVENOUS
  Filled 2023-07-23 (×6): qty 5

## 2023-07-23 MED ORDER — METRONIDAZOLE 500 MG/100ML IV SOLN
500.0000 mg | Freq: Once | INTRAVENOUS | Status: AC
  Administered 2023-07-23: 500 mg via INTRAVENOUS
  Filled 2023-07-23: qty 100

## 2023-07-23 MED ORDER — POLYETHYLENE GLYCOL 3350 17 G PO PACK: 17.0000 g | PACK | Freq: Every day | ORAL | Status: DC | PRN

## 2023-07-23 MED ORDER — DOCUSATE SODIUM 100 MG PO CAPS: 100.0000 mg | ORAL_CAPSULE | Freq: Two times a day (BID) | ORAL | Status: DC | PRN

## 2023-07-23 MED ORDER — ALBUMIN HUMAN 25 % IV SOLN
25.0000 g | Freq: Four times a day (QID) | INTRAVENOUS | Status: DC
  Administered 2023-07-23: 12.5 g via INTRAVENOUS
  Administered 2023-07-24: 25 g via INTRAVENOUS
  Filled 2023-07-23 (×2): qty 100

## 2023-07-23 MED ORDER — METHYLPREDNISOLONE SODIUM SUCC 40 MG IJ SOLR
40.0000 mg | Freq: Two times a day (BID) | INTRAMUSCULAR | Status: DC
  Administered 2023-07-23: 40 mg via INTRAVENOUS
  Filled 2023-07-23: qty 1

## 2023-07-23 MED ORDER — SODIUM CHLORIDE 0.9 % IV SOLN
1.0000 g | INTRAVENOUS | Status: DC
  Administered 2023-07-23: 1 g via INTRAVENOUS
  Filled 2023-07-23 (×2): qty 10

## 2023-07-23 MED ORDER — LIDOCAINE HCL (PF) 1 % IJ SOLN
INTRAMUSCULAR | Status: AC
  Filled 2023-07-23: qty 30

## 2023-07-23 NOTE — ED Notes (Signed)
Amp of D50 in IV in the 24 gauge IV in right shoulder.

## 2023-07-23 NOTE — ED Provider Notes (Signed)
[facility] AT [facility] Provider Note   CSN: [id number] Arrival date & time: 08/04/2023  1447     History  Chief Complaint  Patient presents with   Altered Mental Status    Andrea Chavez is a 42 y.o. female with past medical history significant for hypertension who presents with new confusion, stool incontinence, obvious jaundice, and abdominal swelling. Unclear if any known hx of liver disease. Alcohol abuse noted in chart but patient only aware of name at this time, otherwise cannot answer most questions about additional hx. Denies pain.   HPI     Home Medications Prior to Admission medications   Medication Sig Start Date End Date Taking? Authorizing Provider  thiamine (VITAMIN B1) 100 MG tablet Take 100 mg by mouth daily. 12/20/22  Yes [provider]      Allergies    Patient has no known allergies.    Review of Systems   Review of Systems  Reason unable to perform ROS: AMS.    Physical Exam Updated Vital Signs BP 115/79   Pulse (!) 118   Temp 98.1 F (36.7 C) (Oral)   Resp (!) 32   Ht 5\' 8"  (1.727 m)   Wt 63.5 kg   SpO2 100%   BMI 21.29 kg/m  Physical Exam Vitals and nursing note reviewed.  Constitutional:      General: She is not in acute distress.    Appearance: Normal appearance. She is ill-appearing.  HENT:     Head: Normocephalic and atraumatic.  Eyes:     General: Scleral icterus present.        Right eye: No discharge.        Left eye: No discharge.     Comments: Extreme scleral icterus, highlighter yellow sclera bilaterally  Cardiovascular:     Rate and Rhythm: Normal rate and regular rhythm.     Heart sounds: No murmur heard.    No friction rub. No gallop.  Pulmonary:     Effort: Pulmonary effort is normal.     Breath sounds: Normal breath sounds.  Abdominal:     General: Bowel sounds are normal.     Palpations: Abdomen is soft.     Comments: Extremely swollen, protuberant abdomen with  ascites  Skin:    General: Skin is warm and dry.     Capillary Refill: Capillary refill takes less than 2 seconds.  Neurological:     Mental Status: She is alert.     Comments: Disoriented, responds to name, otherwise cannot answer any questions appropriately.  Psychiatric:        Mood and Affect: Mood normal.        Behavior: Behavior normal.     ED Results / Procedures / Treatments   Labs (all labs ordered are listed, but only abnormal results are displayed) Labs Reviewed  CBC - Abnormal; Notable for the following components:      Result Value   RBC 2.66 (*)    Hemoglobin 9.2 (*)    HCT 28.6 (*)    MCV 107.5 (*)    MCH 34.6 (*)    RDW 20.1 (*)    nRBC 0.3 (*)    All other components within normal limits  COMPREHENSIVE METABOLIC PANEL - Abnormal; Notable for the following components:   Sodium 130 (*)    Chloride 88 (*)    CO2 9 (*)    Glucose, Bld 134 (*)    BUN 36 (*)  Creatinine, Ser 4.79 (*)    Calcium 8.1 (*)    Total Protein 8.3 (*)    Albumin 2.3 (*)    AST 361 (*)    ALT 87 (*)    Alkaline Phosphatase 159 (*)    Total Bilirubin 24.5 (*)    GFR, Estimated 11 (*)    Anion gap 33 (*)    All other components within normal limits  AMMONIA - Abnormal; Notable for the following components:   Ammonia 140 (*)    All other components within normal limits  PROTIME-INR - Abnormal; Notable for the following components:   Prothrombin Time 23.8 (*)    INR 2.1 (*)    All other components within normal limits  APTT - Abnormal; Notable for the following components:   aPTT 46 (*)    All other components within normal limits  I-STAT CG4 LACTIC ACID, ED - Abnormal; Notable for the following components:   Lactic Acid, Venous 14.5 (*)    All other components within normal limits  CBG MONITORING, ED - Abnormal; Notable for the following components:   Glucose-Capillary 25 (*)    All other components within normal limits  CBG MONITORING, ED - Abnormal; Notable for the  following components:   Glucose-Capillary 19 (*)    All other components within normal limits  CBG MONITORING, ED - Abnormal; Notable for the following components:   Glucose-Capillary 156 (*)    All other components within normal limits  CBG MONITORING, ED - Abnormal; Notable for the following components:   Glucose-Capillary 159 (*)    All other components within normal limits  CBG MONITORING, ED - Abnormal; Notable for the following components:   Glucose-Capillary 151 (*)    All other components within normal limits  CBG MONITORING, ED - Abnormal; Notable for the following components:   Glucose-Capillary 124 (*)    All other components within normal limits  CULTURE, BLOOD (ROUTINE X 2)  CULTURE, BLOOD (ROUTINE X 2)  HCG, SERUM, QUALITATIVE  URINALYSIS, ROUTINE W REFLEX MICROSCOPIC  LACTIC ACID, PLASMA  HIV ANTIBODY (ROUTINE TESTING W REFLEX)  URINALYSIS, ROUTINE W REFLEX MICROSCOPIC  CBC  MAGNESIUM  PHOSPHORUS  PROTIME-INR  COMPREHENSIVE METABOLIC PANEL  I-STAT CHEM 8, ED  I-STAT CG4 LACTIC ACID, ED    EKG None  Radiology CT ABDOMEN PELVIS WO CONTRAST  Result Date: 07/10/2023 CLINICAL DATA:  Abdominal pain and distension.  Overt jaundiced. EXAM: CT ABDOMEN AND PELVIS WITHOUT CONTRAST TECHNIQUE: Multidetector CT imaging of the abdomen and pelvis was performed following the standard protocol without IV contrast. RADIATION DOSE REDUCTION: This exam was performed according to the departmental dose-optimization program which includes automated exposure control, adjustment of the mA and/or kV according to patient size and/or use of iterative reconstruction technique. COMPARISON:  None Available. FINDINGS: Lower chest: No acute abnormality. Hepatobiliary: Grossly abnormal appearance of the liver which is hypoattenuated, heterogeneous and lobulated contours. Pancreas: Numerous calcifications throughout the pancreas. Spleen: Normal in size without focal abnormality. Adrenals/Urinary  Tract: Adrenal glands are unremarkable. Kidneys are normal, without renal calculi, focal lesion, or hydronephrosis. Bladder is unremarkable. Stomach/Bowel: Stomach is within normal limits. No evidence of appendicitis. No evidence of bowel wall thickening, distention, or inflammatory changes. Vascular/Lymphatic: No significant vascular findings are present. No enlarged abdominal or pelvic lymph nodes. Reproductive: Uterus and bilateral adnexa are unremarkable. Other: Large volume abdominopelvic ascites. Anterior abdominal wall diastasis. Musculoskeletal: No acute or significant osseous findings. IMPRESSION: 1. Grossly abnormal appearance of the liver which is hypoattenuated,  heterogeneous and with lobulated contours. Findings are concerning for cirrhosis or acute liver disease. 2. Large volume abdominopelvic ascites. 3. Numerous calcifications throughout the pancreas, usually associated with chronic pancreatitis. 4. Anterior abdominal wall diastasis. Electronically Signed   By: Ted Mcalpine M.D.   On: 07/16/2023 18:29   CT Head Wo Contrast  Result Date: 07/30/2023 CLINICAL DATA:  Mental status change, unknown cause EXAM: CT HEAD WITHOUT CONTRAST TECHNIQUE: Contiguous axial images were obtained from the base of the skull through the vertex without intravenous contrast. RADIATION DOSE REDUCTION: This exam was performed according to the departmental dose-optimization program which includes automated exposure control, adjustment of the mA and/or kV according to patient size and/or use of iterative reconstruction technique. COMPARISON:  None Available. FINDINGS: Brain: No evidence of acute infarction, hemorrhage, hydrocephalus, extra-axial collection or mass lesion/mass effect. Vascular: No hyperdense vessel or unexpected calcification. Skull: Normal. Negative for fracture or focal lesion. Sinuses/Orbits: No acute finding. IMPRESSION: No evidence of acute intracranial abnormality. Electronically Signed   By:  Feliberto Harts M.D.   On: 07/30/2023 18:20   DG Chest Portable 1 View  Result Date: 07/08/2023 CLINICAL DATA:  Sepsis EXAM: PORTABLE CHEST 1 VIEW COMPARISON:  None Available. FINDINGS: The heart size and mediastinal contours are within normal limits. Low lung volumes with elevation of the right hemidiaphragm. Mildly increased interstitial markings throughout both lungs. No pleural effusion or pneumothorax. The visualized skeletal structures are unremarkable. IMPRESSION: Low lung volumes with elevation of the right hemidiaphragm. Mildly increased interstitial markings throughout both lungs, which may reflect bronchovascular crowding versus atypical/viral infection. Electronically Signed   By: Duanne Guess D.O.   On: 07/27/2023 16:13    Procedures .Critical Care  Performed by: Olene Floss, PA-C Authorized by: Olene Floss, PA-C   Critical care provider statement:    Critical care time (minutes):  75   Critical care was necessary to treat or prevent imminent or life-threatening deterioration of the following conditions:  Shock, hepatic failure and renal failure   Critical care was time spent personally by me on the following activities:  Development of treatment plan with patient or surrogate, discussions with consultants, evaluation of patient's response to treatment, examination of patient, ordering and review of laboratory studies, ordering and review of radiographic studies, ordering and performing treatments and interventions, pulse oximetry, re-evaluation of patient's condition and review of old charts   Care discussed with: admitting provider       Medications Ordered in ED Medications  dextrose 5 % and 0.45 % NaCl infusion ( Intravenous New Bag/Given 08/03/2023 1537)  docusate sodium (COLACE) capsule 100 mg (has no administration in time range)  polyethylene glycol (MIRALAX / GLYCOLAX) packet 17 g (has no administration in time range)  methylPREDNISolone sodium  succinate (SOLU-MEDROL) 40 mg/mL injection 40 mg (has no administration in time range)  cefTRIAXone (ROCEPHIN) 1 g in sodium chloride 0.9 % 100 mL IVPB (has no administration in time range)  sodium bicarbonate 150 mEq in dextrose 5 % 1,150 mL infusion (has no administration in time range)  lactated ringers bolus 1,000 mL (1,000 mLs Intravenous New Bag/Given 07/11/2023 1707)    And  lactated ringers bolus 1,000 mL (1,000 mLs Intravenous New Bag/Given 07/26/2023 1707)  ceFEPIme (MAXIPIME) 2 g in sodium chloride 0.9 % 100 mL IVPB (0 g Intravenous Stopped 08/05/2023 1652)  metroNIDAZOLE (FLAGYL) IVPB 500 mg (0 mg Intravenous Stopped 07/28/2023 1729)  dextrose 50 % solution 50 mL (50 mLs Intravenous Given 08/01/2023 1520)  glucagon (human  recombinant) (GLUCAGEN) 1 MG injection ( Intramuscular Given 07/19/2023 1530)  lidocaine (PF) (XYLOCAINE) 1 % injection (  Given by Other 07/25/2023 1532)  thiamine (VITAMIN B1) injection 100 mg (100 mg Intravenous Given 07/20/2023 1618)  lidocaine (PF) (XYLOCAINE) 1 % injection (  Given by Other 07/07/2023 1546)    ED Course/ Medical Decision Making/ A&P Clinical Course as of 07/14/2023 1847  Sun Jul 23, 2023  1641 Patient with difficult to establish IV access, she has 1 24-gauge in the right shoulder, as well as an IO line in the left tibia. [CP]  1830 MELD score of 41 [CP]    Clinical Course User Index [CP] Olene Floss, PA-C                                 Medical Decision Making Amount and/or Complexity of Data Reviewed Labs: ordered. Radiology: ordered.   This patient is a 42 y.o. female who presents to the ED for concern of altered mental status,, this involves an extensive number of treatment options, and is a complaint that carries with it a high risk of complications and morbidity. The emergent differential diagnosis prior to evaluation includes, but is not limited to,  CVA, seizure, hypotension, sepsis, hypoglycemia, hypoxic encephalopathy, metabolic  encephalopathy, polypharmacy, substance abuse, developing dementia or alzheimers, meningitis, encephalitis, hypertensive emergency, other systemic infection, acute alcohol intoxication, acute alcohol or other drug withdrawal or psychiatric manifestation vs other --based on her initial evaluation high suspicion that patient is in acute liver failure, with ascites. This is not an exhaustive differential.   Past Medical History / Co-morbidities / Social History: Alcohol abuse  Additional history: Chart reviewed. Pertinent results include: Patient with no significant past medical history on file, other than I can see a noted history of alcohol abuse, I do not see any documented history of acute liver failure  Physical Exam: Physical exam performed. The pertinent findings include: Extremely ill-appearing, without septic appearance, she is confused, aroused with touch and by voice after glucose improved but answers are nonsensical.  She has highlighter yellow sclera bilaterally, she has distended protuberant abdomen with fluid wave.  Lab Tests: I ordered, and personally interpreted labs.  The pertinent results include: Severe lactic acidosis, lactic acid 14.5.  Her CMP is notable for hyponatremia, sodium 130, bicarb of 9, glucose 134, BUN 36, creatinine 4.79, AST 361, ALT 87, and alkaline phosphatase at 159.  Her total bilirubin is severely elevated at 24.5 and she has an anion gap of 33.  She has an ammonia of 140, CBC notable for no leukocytosis, she does have anemia, with hemoglobin of 9.2.  Normal platelets.   Imaging Studies: I ordered imaging studies including plain film chest x-ray, CT abdomen pelvis without contrast, CT head without contrast. I independently visualized and interpreted imaging which showed large volume ascites, no evidence of acute intra abdominal abnormality other than the large volume ascites and grossly abnormal appearance of liver, plain film chest x-ray with elevated right  hemidiaphragm, significant hazy opacity which may be secondary to fluid overload versus atypical infection. I agree with the radiologist interpretation.   Cardiac Monitoring:  The patient was maintained on a cardiac monitor.  My attending physician Dr. Estell Harpin viewed and interpreted the cardiac monitored which showed an underlying rhythm of: Sinus tachycardia, borderline prolonged QT. I agree with this interpretation.   Medications: I ordered medication including 30 cc/kg fluid bolus, thiamine, cefepime, Flagyl, glucagon,  D50 for hypoglycemia, sepsis on arrival, with suspected intra-abdominal source, and known history of alcohol abuse with thiamine.   Consultations Obtained: I requested consultation with the critical care, spoke with Zenia Resides, NP  and discussed lab and imaging findings as well as pertinent plan - they recommend: admission   Disposition: After consideration of the diagnostic results and the patients response to treatment, I feel that patient would benefit from admission for acute liver failure, acute kidney failure, she has a high risk of mortality, she is not a candidate for liver transplant if she is still drinking daily, she has a MELD score of 41, 10% survivability at 90 days.   I discussed this case with my attending physician Dr. Estell Harpin who cosigned this note including patient's presenting symptoms, physical exam, and planned diagnostics and interventions. Attending physician stated agreement with plan or made changes to plan which were implemented.    Final Clinical Impression(s) / ED Diagnoses Final diagnoses:  Acute renal failure, unspecified acute renal failure type (HCC)  Acute liver failure without hepatic coma    Rx / DC Orders ED Discharge Orders     None         Olene Floss, PA-C 07/11/2023 1847    Bethann Berkshire, MD 07/25/23 1315

## 2023-07-23 NOTE — ED Triage Notes (Signed)
Patient bib EMS from home. Daughter of patient told ems that her eyes have been yellow for 2 days and her abdomen has been swollen for 2 weeks. Possible history of cirrhoses.   Mental status has decreased today, she is not talking but withdrawing from pain but alert her eyes are open and looking around.   EMS attempted IV multiple times enroute was unable to get one. CBG with EMS was 53 they gave 1mg  of glucagon.

## 2023-07-23 NOTE — IPAL (Signed)
  Interdisciplinary Goals of Care Family Meeting   Date carried out: 07/16/2023  Location of the meeting: Conference room  Member's involved: Family Member or next of kin and Other: PA-C  Durable Power of Insurance risk surveyor: Mother Andrea Chavez  Discussion: We discussed goals of care for Andrea Chavez .  Spoke with mother, Andrea Chavez, regarding daughter's overall poor prognosis w/ decompensated liver cirrhosis and multiorgan failure. Meld score 40. Given patient's poor prognosis decision made to make DNR/DNI. Mother would not want aggressive procedures such as central line placement or paracentesis. She just wants patient to be comfortable but is going to speak with family before making final decision to transition to comfort care. Will continue supportive care. Offered chaplain services but states she is okay and is calling her bishop to come in.   Code status:   Code Status: Limited: Do not attempt resuscitation (DNR) -DNR-LIMITED -Do Not Intubate/DNI    Disposition: Continue current acute care  Time spent for the meeting: 40 minutes    Lidia Collum, PA-C  07/15/2023, 10:38 PM

## 2023-07-23 NOTE — ED Notes (Signed)
Phlebotomy called for lab draws

## 2023-07-23 NOTE — Progress Notes (Addendum)
eLink Physician-Brief Progress Note Patient Name: Andrea Chavez DOB: 1981/04/09 MRN: [id number]   Date of Service  07/15/2023  HPI/Events of Note  14 F admitted to ICU for  Acute decompensated lover cirrhosis, AKI, AGMA, encephalopathy, elevated MELD score 40. Lactic acidosis > 14. Anemia.Hypoglycemia.  - has I/O line. Discussed with ground team Dr Briant Sites. Due to poor prognosis, not going for a central line for now, waiting for family to make decision about code status, central line.  - s/p over 2 lit of LR bolus from ED.  - continue bicarb /NAC drip, albumin q6 hr, SBP prophylaxis. On thiamine. - follow labs.   Data: CT H neg CT abdomen: ascites. No hematoma.  TB > 24, LA 14.5 Hg 9.2 Plt 164    eICU Interventions  - VTE Scd for now Guarded prognosis -asp and sz precautions. - watch for hypoglycemia, if recurring, will need D10 drip also.  - bruise on right bedside on upper back to all the way down to buttocks, looks not new, sub acute. Not grimacing on pressing on it.  Discussed with RN .       Intervention Category Major Interventions: Acid-Base disturbance - evaluation and management;Change in mental status - evaluation and management Minor Interventions: Communication with other healthcare providers and/or family Evaluation Type: New Patient Evaluation  Ranee Gosselin 07/13/2023, 8:32 PM  21:16.  Lactulose is oral. Confused, I feel she still risk for aspiration. Will keep NPO for tonight, re assess in AM. Lactulose can be given enema, instead of oral, or if family decides, can place NG tube for same.   22:47 Now DNR/DNI.   00:14 BSRN asking for meds to help with restlessness and being uncomfortable, per RN the mother is asking for these meds and mother is struggling with decsion to transition to full comfort care    BP 77/57 (65)   HR 103    PIV and IO - comfort care not decided yet. - start precedex drip

## 2023-07-23 NOTE — ED Notes (Signed)
Critcia;

## 2023-07-23 NOTE — ED Notes (Signed)
EDP at bedside. Attempting access. 8+ unsuccessful attempts by RN's, pamamedics, and EDP.

## 2023-07-23 NOTE — ED Notes (Signed)
1mg  glucagon given in right thigh

## 2023-07-23 NOTE — ED Notes (Signed)
Inserting IO at this time for emergency access.

## 2023-07-23 NOTE — Progress Notes (Signed)
ED Pharmacy Antibiotic Sign Off An antibiotic consult was received from an ED provider for cefepime per pharmacy dosing for intra-abdominal infection. A chart review was completed to assess appropriateness.   The following one time order(s) were placed:  Cefepime 2g IV x 1  Further antibiotic and/or antibiotic pharmacy consults should be ordered by the admitting provider if indicated.   Thank you for allowing pharmacy to be a part of this patient's care.   Daylene Posey, [facility]  Clinical Pharmacist 07/28/2023 3:09 PM

## 2023-07-23 NOTE — ED Notes (Signed)
Phlebotomy at bedside.

## 2023-07-23 NOTE — Sepsis Progress Note (Addendum)
Elink following code sepsis  1828 Notified bedside nurse of need to draw repeat lactic acid.

## 2023-07-23 NOTE — H&P (Signed)
NAME:  BERINA GIESEKING, MRN:  [id number], DOB:  12-13-1980, LOS: 0 ADMISSION DATE:  08/04/2023, CONSULTATION DATE:  11/17 REFERRING MD:  christian, CHIEF COMPLAINT:  ALF    History of Present Illness:  42 year old female for who EMS was called by daughter as pt has had increased abd swelling x 2 weeks and yellow eye discoloration x 2d complicated further by change in mental status. On arrival of EMS glucose was 53. Got glucagon, IO inserted on arrival to ED as was difficult IV start. Cultures sent.  Initial eval Glucose 25 (treated)-->124 currently Lactic acid 14.5 Ast 361 alt 87 alk po4 159 Ammonia 140  BUN 36 Creatinine 4.79 Bicarb 9 INR 2.1 Drinks daily alcohol.  Usually vodka  Pertinent  Medical History  Umbilical hernia, tobacco abuse, mod ETOH consumption   Significant Hospital Events: Including procedures, antibiotic start and stop dates in addition to other pertinent events   11/17 admitted w/ hypoglycemia, hepatic encephalopathy, lactic acidosis, and ALF  Interim History / Subjective:  Confused speech slurred encephalopathic  Objective   Blood pressure 115/79, pulse (!) 118, temperature 98.1 F (36.7 C), temperature source Oral, resp. rate (!) 32, height 5\' 8"  (1.727 m), weight 63.5 kg, SpO2 100%.       No intake or output data in the 24 hours ending 07/15/2023 1812 Filed Weights   07/16/2023 1500  Weight: 63.5 kg    Examination: General: Acute on chronically ill appearing 42 year old female sclera are icteric mucous membranes are dry she is confused but no distress HENT: Mucous membranes dry sclera icteric Lungs: Clear diminished bases Cardiovascular: Regular rate and rhythm Abdomen: Distended shifting dullness positive large volume ascites bowel sounds present Extremities: No significant edema pulses strong has an IO in the left lower extremity Neuro: Awake follows commands has some expressive aphasia GU: Due to void  Resolved Hospital Problem list      Assessment & Plan:  Acute liver failure secondary to acute alcoholic hepatitis in a patient with baseline alcoholic cirrhosis, thrombocytopenia and coagulopathy  Maddrey's discrimination score 83.4/MELD score 40 Not a candidate for transplant given active ETOH Plan Admit to ICU Starting IV methylpred Serial LFT, trend ammonia Start IV N-acetylcysteine infusion  Consider paracentesis  Empiric ceftriaxone for SBT coverage Not candidate for transplant.   Lactic acidosis 2/2 above Plan Resuscitation  Serial lactate  Acute hepatic encephalopathy  Plan Start lactulose and rifaximin Do not think she is candidate for CRRT  Serial LFTs and ammonia Add thiamine 500mg  daily x 3 d then 200mg  daily for 5d.   Acute renal failure 2/2 volume depletion  Plan Renal dose meds Target MAP 85 Scheduled albumin 1/gm/kg (100mg  total) x 2 d then 20-240gmd after  Strict I&O Serial chems  Anemia of critical illness Plan Trend cbc   Hypoglycemia Plan Cont dextrose  Monitor  Best Practice (right click and "Reselect all SmartList Selections" daily)   Diet/type: NPO w/ oral meds DVT prophylaxis: SCD Pressure ulcer(s). pressure ulcer stage non-found GI prophylaxis: PPI Lines: N/A Foley:  Yes, and it is still needed Code Status:  full code Last date of multidisciplinary goals of care discussion [pending]  Labs   CBC: Recent Labs  Lab 08/02/2023 1622  WBC 9.8  HGB 9.2*  HCT 28.6*  MCV 107.5*  PLT 164    Basic Metabolic Panel: Recent Labs  Lab 07/16/2023 1622  NA 130*  K 3.9  CL 88*  CO2 9*  GLUCOSE 134*  BUN 36*  CREATININE 4.79*  CALCIUM 8.1*   GFR: Estimated Creatinine Clearance: 15.2 mL/min (A) (by C-G formula based on SCr of 4.79 mg/dL (H)). Recent Labs  Lab 07/26/2023 1622 07/15/2023 1642  WBC 9.8  --   LATICACIDVEN  --  14.5*    Liver Function Tests: Recent Labs  Lab 08/05/2023 1622  AST 361*  ALT 87*  ALKPHOS 159*  BILITOT 24.5*  PROT 8.3*  ALBUMIN  2.3*   No results for input(s): "LIPASE", "AMYLASE" in the last 168 hours. Recent Labs  Lab 07/19/2023 1622  AMMONIA 140*    ABG No results found for: "PHART", "PCO2ART", "PO2ART", "HCO3", "TCO2", "ACIDBASEDEF", "O2SAT"   Coagulation Profile: Recent Labs  Lab 07/31/2023 1622  INR 2.1*    Cardiac Enzymes: No results for input(s): "CKTOTAL", "CKMB", "CKMBINDEX", "TROPONINI" in the last 168 hours.  HbA1C: No results found for: "HGBA1C"  CBG: Recent Labs  Lab 07/17/2023 1513 07/08/2023 1525 07/12/2023 1534 07/07/2023 1557 07/11/2023 1710  GLUCAP 19* 156* 159* 151* 124*    Review of Systems:   Not able   Past Medical History:  She,  has a past medical history of Hypertension.   Surgical History:  History reviewed. No pertinent surgical history.   Social History:   reports that she has been smoking. She has never used smokeless tobacco.   Family History:  Her family history includes Cancer - Other in her paternal grandmother; Cardiomyopathy in her father; Diabetes in her paternal grandmother; Heart disease in her paternal grandfather; Sudden Cardiac Death in her father; Syncope episode in her father.   Allergies No Known Allergies   Home Medications  Prior to Admission medications   Medication Sig Start Date End Date Taking? Authorizing Provider  thiamine (VITAMIN B1) 100 MG tablet Take 100 mg by mouth daily. 12/20/22  Yes [provider]     Critical care time: 45 min    Simonne Martinet ACNP-BC [facility] Pulmonary/Critical Care Pager # [phone] OR # [phone] if no answer

## 2023-07-23 NOTE — ED Notes (Signed)
X-ray at bedside

## 2023-07-23 NOTE — Progress Notes (Signed)
Mother is on way Have told family this is not survivable  Not candidate for transplant  Plan Cont supportive care Lactulose (for hepatic encephalopathy) Starting bicarb gtt Starting N-acetylcysteine gtt Steroids for acute alcoholic hepatitis Decide on dx para, cont albumin x48rs empiric ceftriaxone GOC when family arrives she is unlikely to survive and do not recommend escalation should she continue to deteriorate

## 2023-07-23 NOTE — ED Notes (Signed)
EDP notified RN to start antibitoics before cultures are drawn.

## 2023-07-24 MED ORDER — DEXMEDETOMIDINE HCL IN NACL 400 MCG/100ML IV SOLN
0.0000 ug/kg/h | INTRAVENOUS | Status: DC
  Filled 2023-07-24: qty 100

## 2023-07-24 MED ORDER — SODIUM BICARBONATE 8.4 % IV SOLN
100.0000 meq | Freq: Once | INTRAVENOUS | Status: AC
  Administered 2023-07-24: 100 meq via INTRAVENOUS
  Filled 2023-07-24: qty 100

## 2023-07-24 MED ORDER — LORAZEPAM 2 MG/ML IJ SOLN
2.0000 mg | INTRAMUSCULAR | Status: DC | PRN
  Administered 2023-07-24: 2 mg via INTRAVENOUS
  Filled 2023-07-24: qty 1

## 2023-07-28 LAB — CULTURE, BLOOD (ROUTINE X 2)
Culture: NO GROWTH
Culture: NO GROWTH
Special Requests: ADEQUATE

## 2023-08-06 NOTE — Progress Notes (Signed)
   07/07/2023 0359  Spiritual Encounters  Type of Visit Initial  Care provided to: [facility] partners present during encounter Nurse  Referral source Nurse (RN/NT/LPN)  Reason for visit Patient death  OnCall Visit Yes   Chaplain called to bedside as Pt has died.  Compassionate presence offered to mother.  Chaplain worked to encourage life review and story telling, but mother's grief was too great for that.  Mother did mentioned relatives waiting in the ED waiting area.  Chaplain found them and escorted them to bedside.  Prayer was offered when requested, and chaplain dismissed himself to allow the family their privacy.  Chaplain services remain available by Spiritual Consult or for emergent cases, paging [phone]  Chaplain Raelene Bott, MDiv [email or web address] [phone]

## 2023-08-06 NOTE — Progress Notes (Signed)
This RN responded to bradycardia alarm to find pt unresponsive and bradycardic in the 30's with no palpable pulses.  This rhythm rapidly changed to asystole on the monitor during assessment.  This RN and fellow RN [facility] RN called time of death at 47.  No palplable carotid pulses were felt over a period of one minute and lung sounds were unable to be auscultated in any of the 5 lung fields.  Pt DNR/DNI at time of passing.  Pts mother present at bedside at all times throughout the evening.  Chaplain at bedside to offer prayer and condolences to mother.  [facility] has been notified and preparations will be made to preserve eyes/tissues.

## 2023-08-06 NOTE — Death Summary Note (Signed)
Death Summary   Pt is a 42 year old female with hx of alcohol and tobacco use disorder presented to the ED after worsening juandice for 2 weeks and 2 days of altered mental status. She has poor PCP follow up for her chronic conditions. On arrival her glucose was 53. She got glucagon. Initial lab work was concerning for hypoperfusion secondary to her cirrhosis and low PO intake. She was admitted to the ICU and treated aggressively. Her MELD score was 40 portending a poor prognosis. Unfortunately pt continued to deteriorate and family was informed who decided against further invasive intervention given her poor overall health.  Patient details  Name: PINK TENENBAUM  MRN: [id number]  DOB: 02-08-1981  Date of death: Aug 02, 2023 Time of Death 0255 am  Profile data   Living situation: home  Independent with ADLs:  yes Bed bound or WC bound : no  Code status  Advanced directives:DNR established during hospitalization   Reason for admission   Acute Decompensated Liver Cirrhosis  Preliminary Cause of death   Shock 2/2 acute decompensated liver cirrhosis  Secondary diagnoses, contributing co-morbidities & complicating factors  Acute liver failure secondary to acute alcoholic hepatitis in a patient with baseline alcoholic cirrhosis, thrombocytopenia and coagulopathy  Lactic acidosis 2/2 hypoperfusion Acute hepatic encephalopathy  Acute renal failure 2/2 volume depletion vs HRS Anemia of critical illness Hypoglycemia  Condition on arrival (check all that apply)  PENCIE DEBS is a 42 y.o. female admitted from home.  Initially had circulatory shock and acute renal failure and acute metabolic encephalopathy in setting of acute decompensated liver cirrhosis.   Hospital course   Acute liver failure secondary to acute alcoholic hepatitis in a patient with baseline alcoholic cirrhosis, thrombocytopenia and coagulopathy  Maddrey's discrimination score 83.4/MELD score 40. Pt was not a candidate  for transplant given active ETOH. She was admitted to the ICU and started on IV methylprednisolone. Given NAC infusion and started on empiric abx for SBP prophylaxis. Plan was to continue to trend LFTs and her ammonia. Unfortunately pt's condition continued to worsen and pt passed away later within 12 hours of admission despite aggressive treatment.   Lactic acidosis 2/2 above Pt was given IVF and lactic acid was trended.   Acute hepatic encephalopathy  Pt was started on lactulose and rifaximin. She was also started on thiamine 500 mg daily.     Acute renal failure 2/2 volume depletion  Plan was to renally dose meds and monitor her renal fxn after IVF. She was also started on IV albumin to help improve perfusion given her liver disease.    Anemia of critical illness Was stable at 9.2 with no signs of bleeding.    Hypoglycemia Initially pt was given glucagon and then started on dextrose gtt. CBG improved to normoglycemia.   Gwenevere Abbot, MD   Eligha Bridegroom. [facility] Internal Medicine Residency, PGY-3

## 2023-08-06 DEATH — deceased
